# Patient Record
Sex: Female | Born: 1945 | Race: Black or African American | Hispanic: No | Marital: Married | Smoking: Never smoker
Health system: Southern US, Community
[De-identification: ages and names within clinical notes are randomized; demographics above are authoritative.]

## PROBLEM LIST (undated history)

## (undated) DIAGNOSIS — N390 Urinary tract infection, site not specified: Secondary | ICD-10-CM

## (undated) DIAGNOSIS — I700 Atherosclerosis of aorta: Secondary | ICD-10-CM

## (undated) DIAGNOSIS — J189 Pneumonia, unspecified organism: Secondary | ICD-10-CM

## (undated) DIAGNOSIS — M1990 Unspecified osteoarthritis, unspecified site: Secondary | ICD-10-CM

## (undated) DIAGNOSIS — K5790 Diverticulosis of intestine, part unspecified, without perforation or abscess without bleeding: Secondary | ICD-10-CM

## (undated) HISTORY — DX: Pneumonia, unspecified organism: J18.9

## (undated) HISTORY — PX: BREAST EXCISIONAL BIOPSY: SUR124

## (undated) HISTORY — DX: Atherosclerosis of aorta: I70.0

## (undated) HISTORY — DX: Diverticulosis of intestine, part unspecified, without perforation or abscess without bleeding: K57.90

## (undated) HISTORY — DX: Urinary tract infection, site not specified: N39.0

## (undated) HISTORY — DX: Unspecified osteoarthritis, unspecified site: M19.90

---

## 1999-03-09 HISTORY — PX: LUNG BIOPSY: SHX232

## 2001-02-21 ENCOUNTER — Other Ambulatory Visit: Admission: RE | Admit: 2001-02-21 | Discharge: 2001-02-21 | Payer: Self-pay | Admitting: Internal Medicine

## 2003-02-07 ENCOUNTER — Ambulatory Visit (HOSPITAL_COMMUNITY): Admission: RE | Admit: 2003-02-07 | Discharge: 2003-02-07 | Payer: Self-pay | Admitting: Gastroenterology

## 2004-02-10 ENCOUNTER — Encounter: Admission: RE | Admit: 2004-02-10 | Discharge: 2004-02-10 | Payer: Self-pay | Admitting: Rheumatology

## 2004-02-25 ENCOUNTER — Encounter: Admission: RE | Admit: 2004-02-25 | Discharge: 2004-02-25 | Payer: Self-pay | Admitting: Neurosurgery

## 2004-07-15 ENCOUNTER — Encounter: Admission: RE | Admit: 2004-07-15 | Discharge: 2004-07-15 | Payer: Self-pay | Admitting: Rheumatology

## 2004-07-16 ENCOUNTER — Encounter: Admission: RE | Admit: 2004-07-16 | Discharge: 2004-07-16 | Payer: Self-pay | Admitting: Rheumatology

## 2004-07-22 ENCOUNTER — Ambulatory Visit (HOSPITAL_COMMUNITY): Admission: RE | Admit: 2004-07-22 | Discharge: 2004-07-22 | Payer: Self-pay | Admitting: Internal Medicine

## 2004-07-23 ENCOUNTER — Encounter: Admission: RE | Admit: 2004-07-23 | Discharge: 2004-07-23 | Payer: Self-pay | Admitting: Rheumatology

## 2005-09-22 ENCOUNTER — Encounter: Admission: RE | Admit: 2005-09-22 | Discharge: 2005-09-22 | Payer: Self-pay | Admitting: Rheumatology

## 2005-11-10 ENCOUNTER — Ambulatory Visit (HOSPITAL_COMMUNITY): Admission: RE | Admit: 2005-11-10 | Discharge: 2005-11-10 | Payer: Self-pay | Admitting: Internal Medicine

## 2007-02-23 ENCOUNTER — Ambulatory Visit (HOSPITAL_COMMUNITY): Admission: RE | Admit: 2007-02-23 | Discharge: 2007-02-23 | Payer: Self-pay | Admitting: Internal Medicine

## 2008-02-26 ENCOUNTER — Ambulatory Visit (HOSPITAL_COMMUNITY): Admission: RE | Admit: 2008-02-26 | Discharge: 2008-02-26 | Payer: Self-pay | Admitting: Internal Medicine

## 2009-03-25 ENCOUNTER — Ambulatory Visit (HOSPITAL_COMMUNITY): Admission: RE | Admit: 2009-03-25 | Discharge: 2009-03-25 | Payer: Self-pay | Admitting: Internal Medicine

## 2010-04-06 ENCOUNTER — Other Ambulatory Visit (HOSPITAL_COMMUNITY): Payer: Self-pay | Admitting: Internal Medicine

## 2010-04-06 DIAGNOSIS — Z139 Encounter for screening, unspecified: Secondary | ICD-10-CM

## 2010-04-14 ENCOUNTER — Ambulatory Visit (HOSPITAL_COMMUNITY)
Admission: RE | Admit: 2010-04-14 | Discharge: 2010-04-14 | Disposition: A | Discharge status: Home / Self Care | Payer: BC Managed Care – PPO | Source: Ambulatory Visit | Attending: Internal Medicine | Admitting: Internal Medicine

## 2010-04-14 DIAGNOSIS — Z139 Encounter for screening, unspecified: Secondary | ICD-10-CM

## 2010-04-14 DIAGNOSIS — Z1231 Encounter for screening mammogram for malignant neoplasm of breast: Secondary | ICD-10-CM | POA: Insufficient documentation

## 2010-07-24 NOTE — Op Note (Signed)
Leah Bowen, Leah Bowen                      ACCOUNT NO.:  [id number]   MEDICAL RECORD NO.:  [id number]                   PATIENT TYPE:  AMB   LOCATION:  ENDO                                 FACILITY:   PHYSICIAN:  Anselmo Rod, M.D.               DATE OF BIRTH:  [birth year]   DATE OF PROCEDURE:  02/07/2003  DATE OF DISCHARGE:                                 OPERATIVE REPORT   PROCEDURE PERFORMED:  Colonoscopy.   ENDOSCOPIST:  Anselmo Rod, M.D.   INSTRUMENT USED:  Olympus video colonoscope.   INDICATIONS FOR PROCEDURE:  A 65 year old African-American female with a  history of change in bowel habits undergoing a screening colonoscopy to rule  out colonic polyps, masses, etc.   PREPROCEDURE PREPARATION:  Informed consent was procured from the patient.  The patient was fasted for eight hours prior to procedure and prepped with a  bottle of magnesium citrate and gallon of GoLYTELY the night prior to  procedure.   PREPROCEDURE PHYSICAL:  VITAL SIGNS:  The patient has stable vital signs.  NECK:  Supple.  CHEST:  Clear to auscultation.  CARDIAC:  S1, S2 regular.  ABDOMEN:  Soft with normal bowel sounds.   DESCRIPTION OF PROCEDURE:  The patient was placed in the left lateral  decubitus position and sedated with 50 mg of Demerol and 5 mg of Versed  intravenously. Once the patient was adequately sedated and maintained on low-  flow oxygen and continuous cardiac monitoring, the Olympus video colonoscope  was advanced from the rectum to the cecum and terminal ileum.  The  appendicular orifice and ileocecal valve were clearly visualized and  photographed.  No masses, polyps, erosions, ulcerations or diverticula seen.  The terminal ileum appearing healthy and without lesions.  Retroflexion in  the rectum revealed no abnormalities.   IMPRESSION:  1. Normal colonoscopy of the terminal ileum.  2. No masses, polyps or diverticulosis noted.   RECOMMENDATIONS:  1. High fiber diet  with liberal fluid intake has been advocated.  2. Repeat CRC screening is recommended in the next 10 years unless the     patient develops any abnormal symptoms in the interim.  3. Outpatient followup in the next four weeks or earlier if need be.                                               Anselmo Rod, M.D.   JNM/MEDQ  D:  02/07/2003  T:  02/08/2003  Job:  [id number]

## 2011-05-25 ENCOUNTER — Other Ambulatory Visit (HOSPITAL_COMMUNITY): Payer: Self-pay | Admitting: Internal Medicine

## 2011-05-25 DIAGNOSIS — Z1231 Encounter for screening mammogram for malignant neoplasm of breast: Secondary | ICD-10-CM

## 2011-06-17 ENCOUNTER — Ambulatory Visit (HOSPITAL_COMMUNITY): Payer: Self-pay

## 2011-06-18 ENCOUNTER — Ambulatory Visit (HOSPITAL_COMMUNITY)
Admission: RE | Admit: 2011-06-18 | Discharge: 2011-06-18 | Disposition: A | Discharge status: Home / Self Care | Payer: Medicare Other | Source: Ambulatory Visit | Attending: Internal Medicine | Admitting: Internal Medicine

## 2011-06-18 DIAGNOSIS — Z1231 Encounter for screening mammogram for malignant neoplasm of breast: Secondary | ICD-10-CM

## 2012-05-18 ENCOUNTER — Encounter: Payer: Self-pay | Admitting: Internal Medicine

## 2012-06-01 ENCOUNTER — Encounter: Payer: Self-pay | Admitting: Internal Medicine

## 2012-06-05 ENCOUNTER — Ambulatory Visit (INDEPENDENT_AMBULATORY_CARE_PROVIDER_SITE_OTHER): Payer: Medicare Other | Admitting: Internal Medicine

## 2012-06-05 ENCOUNTER — Encounter: Payer: Self-pay | Admitting: Internal Medicine

## 2012-06-05 ENCOUNTER — Other Ambulatory Visit (INDEPENDENT_AMBULATORY_CARE_PROVIDER_SITE_OTHER): Payer: Medicare Other

## 2012-06-05 VITALS — BP 118/60 | HR 72 | Ht 67.5 in | Wt 141.0 lb

## 2012-06-05 DIAGNOSIS — Z1211 Encounter for screening for malignant neoplasm of colon: Secondary | ICD-10-CM

## 2012-06-05 DIAGNOSIS — M1990 Unspecified osteoarthritis, unspecified site: Secondary | ICD-10-CM | POA: Insufficient documentation

## 2012-06-05 DIAGNOSIS — K5900 Constipation, unspecified: Secondary | ICD-10-CM

## 2012-06-05 DIAGNOSIS — R109 Unspecified abdominal pain: Secondary | ICD-10-CM

## 2012-06-05 LAB — TSH: TSH: 1.41 u[IU]/mL (ref 0.35–5.50)

## 2012-06-05 MED ORDER — POLYETHYLENE GLYCOL 3350 17 GM/SCOOP PO POWD: 17.0000 g | Freq: Every day | ORAL | Status: AC

## 2012-06-05 MED ORDER — PEG-KCL-NACL-NASULF-NA ASC-C 100 G PO SOLR: 1.0000 | Freq: Once | ORAL | Status: DC

## 2012-06-05 NOTE — Progress Notes (Signed)
Patient ID: Leah Bowen, female   DOB: 08-Apr-1945, 67 y.o.   MRN: [id number] HPI: Mrs. Coste is a 67 year old female with a past medical history of arthritis who seen in consultation at the request of Dr. Renae Gloss for evaluation of constipation change in bowel habits and left-sided abdominal pain. The patient reports her last 4-6 weeks of change in her bowel habits in that she's become more constipated. She also was seeing more narrow caliber stools and the feeling of incomplete evacuation. She reports abdominal fullness as well as griping and cramping worse on the left side. Several mites in the past month she has been awakened from sleep with a left-sided cramping abdominal pain. This also seems to be worse when her bladder is full. She modified her diet, specifically by decreasing intake to try to help with the pain. She also decreased her fiber and added olive oil. On one occasion she used an herbal laxative, 2 tablets which was Senokot and this significantly helped. She reports now her bowel movements have returned more towards normal occurring 2 times daily. They are formed a more normal shape. She denies rectal bleeding or melena. She also tried 2 weeks of a probiotic, Align, without much benefit. She still is having some intermittent discomfort in the left mid abdomen. No nausea or vomiting. No fevers or chills. Good appetite. Weight has been stable for her. She has screening colonoscopy performed by Dr. Loreta Ave 10 years ago, but she recalls is normal.  Past Medical History  Diagnosis Date  . Arthritis   . Pneumonia   . UTI (lower urinary tract infection)     Past Surgical History  Procedure Laterality Date  . Lung biopsy  2001    Current Outpatient Prescriptions  Medication Sig Dispense Refill  . peg 3350 powder (MOVIPREP) 100 G SOLR Take 1 kit (100 g total) by mouth once.  1 kit  0  . polyethylene glycol powder (GLYCOLAX/MIRALAX) powder Take 17 g by mouth daily.  255 g  3   No  current facility-administered medications for this visit.    Allergies  Allergen Reactions  . Lactose Intolerance (Gi)     Family History  Problem Relation Age of Onset  . Breast cancer Mother   . Uterine cancer Sister   . Diabetes Mother   . Diabetes Father   . Diabetes Sister   . Heart disease Father   . Kidney disease Father     History   Social History  . Marital Status: Married    Spouse Name: N/A    Number of Children: 1  . Years of Education: N/A   Social History Main Topics  . Smoking status: Never Smoker   . Smokeless tobacco: Never Used  . Alcohol Use: No  . Drug Use: No  . Sexually Active: None   Other Topics Concern  . None   Social History Narrative  . None    ROS: As per history of present illness, otherwise negative  BP 118/60  Pulse 72  Ht 5' 7.5" (1.715 m)  Wt 141 lb (63.957 kg)  BMI 21.75 kg/m2 Constitutional: Well-developed and well-nourished. No distress. HEENT: Normocephalic and atraumatic. Oropharynx is clear and moist. No oropharyngeal exudate. Conjunctivae are normal.  No scleral icterus. Neck: Neck supple. Trachea midline. Cardiovascular: Normal rate, regular rhythm and intact distal pulses. No M/R/G Pulmonary/chest: Effort normal and breath sounds normal. No wheezing, rales or rhonchi. Abdominal: Soft, nontender, nondistended. Bowel sounds active throughout. There are no masses palpable.  No hepatosplenomegaly. Extremities: no clubbing, cyanosis, or edema Lymphadenopathy: No cervical adenopathy noted. Neurological: Alert and oriented to person place and time. Skin: Skin is warm and dry. No rashes noted. Psychiatric: Normal mood and affect. Behavior is normal.  Colonoscopy performed 02/07/2003 -- exam to the cecum and terminal ileum. Normal colonoscopy of the terminal ileum. No masses, polyps, or diverticula noted. (Dr. Loreta Ave)  ASSESSMENT/PLAN:  67 year old female with a past medical history of arthritis who seen in consultation  at the request of Dr. Renae Gloss for evaluation of constipation change in bowel habits and left-sided abdominal pain.  1.  Change in bowel habits, mild constipation with left-sided abdominal discomfort -- the patient is due for repeat colonoscopy from a screening standpoint. Her change in bowel habits seem to be correcting without much intervention and she responded to over-the-counter laxative. I recommended a laxative on a scheduled basis to see if this helps fully relieve her left-sided abdominal discomfort. I recommended MiraLAX 17 g daily. If her stools are too loose she can take either a half dose daily or take 17 g every other day. I will also check a TSH today. Discussed colonoscopy including the risks and benefits and she is agreeable to proceed. Recommendations thereafter. If colonoscopy is unremarkable and her discomfort persists, my recommendation would be for CT scan with contrast. Her exam today is very benign and she has no abdominal tenderness making diverticulitis felt very unlikely

## 2012-06-05 NOTE — Patient Instructions (Addendum)
You have been scheduled for a colonoscopy with propofol. Please follow written instructions given to you at your visit today.  Please pick up your prep kit at the pharmacy within the next 1-3 days. If you use inhalers (even only as needed), please bring them with you on the day of your procedure.  We have sent the following medications to your pharmacy for you to pick up at your convenience: Miralax, take 17g daily. Moviprep; you were given instructions today at your office visit  Your physician has requested that you go to the basement for the following lab work before leaving today: TSH                                               We are excited to introduce MyChart, a new best-in-class service that provides you online access to important information in your electronic medical record. We want to make it easier for you to view your health information - all in one secure location - when and where you need it. We expect MyChart will enhance the quality of care and service we provide.  When you register for MyChart, you can:    View your test results.    Request appointments and receive appointment reminders via email.    Request medication renewals.    View your medical history, allergies, medications and immunizations.    Communicate with your physician's office through a password-protected site.    Conveniently print information such as your medication lists.  To find out if MyChart is right for you, please talk to a member of our clinical staff today. We will gladly answer your questions about this free health and wellness tool.  If you are age 41 or older and want a member of your family to have access to your record, you must provide written consent by completing a proxy form available at our office. Please speak to our clinical staff about guidelines regarding accounts for patients younger than age 36.  As you activate your MyChart account and need any technical assistance, please  call the MyChart technical support line at [phone]) 83-CHART 9083306186) or email your question to [email or web address]. If you email your question(s), please include your name, a return phone number and the best time to reach you.  If you have non-urgent health-related questions, you can send a message to our office through MyChart at [email or web address]. If you have a medical emergency, call 911.  Thank you for using MyChart as your new health and wellness resource!   MyChart licensed from [employer],  [id number]. Patents Pending.

## 2012-06-14 ENCOUNTER — Encounter: Payer: Self-pay | Admitting: Internal Medicine

## 2012-06-14 ENCOUNTER — Ambulatory Visit (AMBULATORY_SURGERY_CENTER): Payer: Medicare Other | Admitting: Internal Medicine

## 2012-06-14 VITALS — BP 110/64 | HR 63 | Temp 97.4°F | Resp 26 | Ht 67.5 in | Wt 141.0 lb

## 2012-06-14 DIAGNOSIS — K5900 Constipation, unspecified: Secondary | ICD-10-CM

## 2012-06-14 DIAGNOSIS — Z1211 Encounter for screening for malignant neoplasm of colon: Secondary | ICD-10-CM

## 2012-06-14 MED ORDER — SODIUM CHLORIDE 0.9 % IV SOLN: 500.0000 mL | INTRAVENOUS | Status: DC

## 2012-06-14 NOTE — Progress Notes (Signed)
A/ox3 pleased with MAC report to Jane RN 

## 2012-06-14 NOTE — Op Note (Signed)
[facility] [address] [place], [address]   COLONOSCOPY PROCEDURE REPORT  PATIENT: Leah Bowen, Leah Bowen  MR#: [id number] BIRTHDATE: 27-Nov-1945 , 66  yrs. old GENDER: Female ENDOSCOPIST: Beverley Fiedler, MD REFERRED XB:[id number], Kim PROCEDURE DATE:  06/14/2012 PROCEDURE:   Colonoscopy, screening ASA CLASS:   Class II INDICATIONS:average risk screening and Change in bowel habits. MEDICATIONS: MAC sedation, administered by CRNA and propofol (Diprivan) 200mg  IV  DESCRIPTION OF PROCEDURE:   After the risks benefits and alternatives of the procedure were thoroughly explained, informed consent was obtained.  A digital rectal exam revealed no rectal mass.   The LB [id number]-H180AL [id number]  endoscope was introduced through the anus and advanced to the cecum, which was identified by both the appendix and ileocecal valve. No adverse events experienced. The quality of the prep was good, using MoviPrep  The instrument was then slowly withdrawn as the colon was fully examined.    COLON FINDINGS: Mild diverticulosis was noted in the descending colon and sigmoid colon.   The colon was otherwise normal.  There was no inflammation, polyps or cancers seen unless previously stated.  Retroflexed views revealed small internal hemorrhoids. The time to cecum=8 minutes 28 seconds.  Withdrawal time=7 minutes 58 seconds.  The scope was withdrawn and the procedure completed. COMPLICATIONS: There were no complications.  ENDOSCOPIC IMPRESSION: 1.   Mild diverticulosis was noted in the descending colon and sigmoid colon 2.   The colon was otherwise normal  RECOMMENDATIONS: 1.  High fiber diet 2.  You should continue to follow colorectal cancer screening guidelines for "routine risk" patients with a repeat colonoscopy in 10 years.  There is no need for FOBT (stool) testing for at least 5 years. 3.   If left-sided abdominal pain persists or worsens, I recommend CT scan abd/pelvis for  further evaluation.   eSigned:  Beverley Fiedler, MD 06/14/2012 3:30 PM   cc: The Patient

## 2012-06-14 NOTE — Progress Notes (Signed)
Patient did not experience any of the following events: a burn prior to discharge; a fall within the facility; wrong site/side/patient/procedure/implant event; or a hospital transfer or hospital admission upon discharge from the facility. (G8907) Patient did not have preoperative order for IV antibiotic SSI prophylaxis. (G8918)  

## 2012-06-14 NOTE — Patient Instructions (Addendum)

## 2012-06-15 ENCOUNTER — Telehealth: Payer: Self-pay | Admitting: *Deleted

## 2012-06-15 NOTE — Telephone Encounter (Signed)
  Follow up Call-  Call back number 06/14/2012  Post procedure Call Back phone  # [phone]  Permission to leave phone message Yes     Patient questions:  Do you have a fever, pain , or abdominal swelling? no Pain Score  0 *  Have you tolerated food without any problems? yes  Have you been able to return to your normal activities? yes  Do you have any questions about your discharge instructions: Diet   no Medications  no Follow up visit  no  Do you have questions or concerns about your Care? no  Actions: * If pain score is 4 or above: No action needed, pain <4.

## 2012-06-29 ENCOUNTER — Telehealth: Payer: Self-pay | Admitting: Internal Medicine

## 2012-06-29 DIAGNOSIS — R1032 Left lower quadrant pain: Secondary | ICD-10-CM

## 2012-06-29 DIAGNOSIS — R109 Unspecified abdominal pain: Secondary | ICD-10-CM

## 2012-06-30 ENCOUNTER — Inpatient Hospital Stay: Admission: RE | Admit: 2012-06-30 | Payer: Medicare Other | Source: Ambulatory Visit

## 2012-06-30 ENCOUNTER — Other Ambulatory Visit (INDEPENDENT_AMBULATORY_CARE_PROVIDER_SITE_OTHER): Payer: Medicare Other

## 2012-06-30 DIAGNOSIS — R109 Unspecified abdominal pain: Secondary | ICD-10-CM

## 2012-06-30 LAB — BASIC METABOLIC PANEL
Chloride: 102 mEq/L (ref 96–112)
GFR: 89.59 mL/min (ref >60.00)
Glucose, Bld: 100 mg/dL — ABNORMAL HIGH (ref 70–99)
Potassium: 4.1 mEq/L (ref 3.5–5.1)
Sodium: 141 mEq/L (ref 135–145)

## 2012-06-30 NOTE — Telephone Encounter (Signed)
Per 06/14/12 COLON, Dr Rhea Belton  Wrote on recommendations that if pt's L side pain persists, we can order a CT.  Dr Rhea Belton was out yesterday, but OK to order per Willette Cluster, NP. Need to r/s for Monday for pt; r/s for 07/03/12 at 0830am. lmom for pt to call back.

## 2012-06-30 NOTE — Telephone Encounter (Signed)
Informed pt of CT appt on 07/03/12, be there at 0815an, nothing to eat or drink after 4:30am, drink contrast at 06:30 and 07:30am. Also informed her she need a lab prior to her CT; pt stated understanding.

## 2012-07-03 ENCOUNTER — Ambulatory Visit (INDEPENDENT_AMBULATORY_CARE_PROVIDER_SITE_OTHER)
Admission: RE | Admit: 2012-07-03 | Discharge: 2012-07-03 | Disposition: A | Discharge status: Home / Self Care | Payer: Medicare Other | Source: Ambulatory Visit | Attending: Internal Medicine | Admitting: Internal Medicine

## 2012-07-03 DIAGNOSIS — R1032 Left lower quadrant pain: Secondary | ICD-10-CM

## 2012-07-03 MED ORDER — IOHEXOL 300 MG/ML  SOLN
100.0000 mL | Freq: Once | INTRAMUSCULAR | Status: AC | PRN
  Administered 2012-07-03: 100 mL via INTRAVENOUS

## 2012-07-05 ENCOUNTER — Telehealth: Payer: Self-pay | Admitting: Internal Medicine

## 2012-07-05 NOTE — Telephone Encounter (Signed)
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 Occasionally lower back pain can lead to anterior abdominal pain - she can discuss this further with her primary care doctor   We also want to assure she is not constipated. I had previously recommended MiraLax which she can continue to use to help keep her bowel habits regular.   Office followup can be offered to assure improvement     Informed pt of results per dr Rhea Belton. She stated understanding and asked that I mail her a copy of the scan; done.

## 2014-05-01 ENCOUNTER — Other Ambulatory Visit: Payer: Self-pay | Admitting: Internal Medicine

## 2014-05-01 DIAGNOSIS — R040 Epistaxis: Secondary | ICD-10-CM

## 2014-05-07 ENCOUNTER — Ambulatory Visit
Admission: RE | Admit: 2014-05-07 | Discharge: 2014-05-07 | Disposition: A | Discharge status: Home / Self Care | Payer: Medicare Other | Source: Ambulatory Visit | Attending: Internal Medicine | Admitting: Internal Medicine

## 2014-05-07 DIAGNOSIS — R040 Epistaxis: Secondary | ICD-10-CM

## 2014-05-24 ENCOUNTER — Other Ambulatory Visit: Payer: Self-pay

## 2014-05-24 DIAGNOSIS — Z1231 Encounter for screening mammogram for malignant neoplasm of breast: Secondary | ICD-10-CM

## 2014-05-24 DIAGNOSIS — R5381 Other malaise: Secondary | ICD-10-CM

## 2014-05-29 ENCOUNTER — Other Ambulatory Visit: Payer: Self-pay | Admitting: Internal Medicine

## 2014-05-29 ENCOUNTER — Other Ambulatory Visit: Payer: Self-pay

## 2014-06-05 ENCOUNTER — Other Ambulatory Visit: Payer: Self-pay | Admitting: Internal Medicine

## 2014-06-05 ENCOUNTER — Other Ambulatory Visit: Payer: Self-pay

## 2014-06-05 DIAGNOSIS — R5381 Other malaise: Secondary | ICD-10-CM

## 2014-06-05 DIAGNOSIS — E2839 Other primary ovarian failure: Secondary | ICD-10-CM

## 2014-06-06 ENCOUNTER — Ambulatory Visit
Admission: RE | Admit: 2014-06-06 | Discharge: 2014-06-06 | Disposition: A | Discharge status: Home / Self Care | Payer: Medicare Other | Source: Ambulatory Visit | Attending: Internal Medicine | Admitting: Internal Medicine

## 2014-06-06 ENCOUNTER — Ambulatory Visit
Admission: RE | Admit: 2014-06-06 | Discharge: 2014-06-06 | Disposition: A | Discharge status: Home / Self Care | Payer: Medicare Other | Source: Ambulatory Visit

## 2014-06-06 DIAGNOSIS — Z1231 Encounter for screening mammogram for malignant neoplasm of breast: Secondary | ICD-10-CM

## 2014-06-06 DIAGNOSIS — E2839 Other primary ovarian failure: Secondary | ICD-10-CM

## 2015-06-04 ENCOUNTER — Other Ambulatory Visit: Payer: Self-pay

## 2015-06-04 DIAGNOSIS — Z1231 Encounter for screening mammogram for malignant neoplasm of breast: Secondary | ICD-10-CM

## 2015-06-23 ENCOUNTER — Ambulatory Visit
Admission: RE | Admit: 2015-06-23 | Discharge: 2015-06-23 | Disposition: A | Discharge status: Home / Self Care | Payer: Medicare Other | Source: Ambulatory Visit

## 2015-06-23 DIAGNOSIS — Z1231 Encounter for screening mammogram for malignant neoplasm of breast: Secondary | ICD-10-CM

## 2016-06-09 ENCOUNTER — Other Ambulatory Visit: Payer: Self-pay | Admitting: Internal Medicine

## 2016-06-09 DIAGNOSIS — E2839 Other primary ovarian failure: Secondary | ICD-10-CM

## 2016-06-09 DIAGNOSIS — Z1231 Encounter for screening mammogram for malignant neoplasm of breast: Secondary | ICD-10-CM

## 2016-07-13 ENCOUNTER — Ambulatory Visit
Admission: RE | Admit: 2016-07-13 | Discharge: 2016-07-13 | Disposition: A | Discharge status: Home / Self Care | Payer: Medicare Other | Source: Ambulatory Visit | Attending: Internal Medicine | Admitting: Internal Medicine

## 2016-07-13 DIAGNOSIS — E2839 Other primary ovarian failure: Secondary | ICD-10-CM

## 2016-07-13 DIAGNOSIS — Z1231 Encounter for screening mammogram for malignant neoplasm of breast: Secondary | ICD-10-CM

## 2018-07-03 ENCOUNTER — Other Ambulatory Visit: Payer: Self-pay | Admitting: Internal Medicine

## 2018-07-03 DIAGNOSIS — Z1231 Encounter for screening mammogram for malignant neoplasm of breast: Secondary | ICD-10-CM

## 2018-08-31 ENCOUNTER — Ambulatory Visit
Admission: RE | Admit: 2018-08-31 | Discharge: 2018-08-31 | Disposition: A | Discharge status: Home / Self Care | Payer: Medicare Other | Source: Ambulatory Visit | Attending: Internal Medicine | Admitting: Internal Medicine

## 2018-08-31 DIAGNOSIS — Z1231 Encounter for screening mammogram for malignant neoplasm of breast: Secondary | ICD-10-CM

## 2019-03-26 ENCOUNTER — Ambulatory Visit: Payer: Medicare Other | Attending: Internal Medicine

## 2019-03-26 DIAGNOSIS — Z20822 Contact with and (suspected) exposure to covid-19: Secondary | ICD-10-CM

## 2019-03-27 LAB — NOVEL CORONAVIRUS, NAA: SARS-CoV-2, NAA: NOT DETECTED

## 2019-05-14 DIAGNOSIS — S161XXA Strain of muscle, fascia and tendon at neck level, initial encounter: Secondary | ICD-10-CM | POA: Diagnosis not present

## 2019-05-14 DIAGNOSIS — M069 Rheumatoid arthritis, unspecified: Secondary | ICD-10-CM | POA: Diagnosis not present

## 2019-05-23 DIAGNOSIS — M2560 Stiffness of unspecified joint, not elsewhere classified: Secondary | ICD-10-CM | POA: Diagnosis not present

## 2019-05-25 DIAGNOSIS — M2560 Stiffness of unspecified joint, not elsewhere classified: Secondary | ICD-10-CM | POA: Diagnosis not present

## 2019-05-29 DIAGNOSIS — M2560 Stiffness of unspecified joint, not elsewhere classified: Secondary | ICD-10-CM | POA: Diagnosis not present

## 2019-06-01 DIAGNOSIS — M2560 Stiffness of unspecified joint, not elsewhere classified: Secondary | ICD-10-CM | POA: Diagnosis not present

## 2019-06-05 DIAGNOSIS — M2560 Stiffness of unspecified joint, not elsewhere classified: Secondary | ICD-10-CM | POA: Diagnosis not present

## 2019-06-08 DIAGNOSIS — M2560 Stiffness of unspecified joint, not elsewhere classified: Secondary | ICD-10-CM | POA: Diagnosis not present

## 2019-06-12 DIAGNOSIS — M2560 Stiffness of unspecified joint, not elsewhere classified: Secondary | ICD-10-CM | POA: Diagnosis not present

## 2019-06-15 DIAGNOSIS — M2560 Stiffness of unspecified joint, not elsewhere classified: Secondary | ICD-10-CM | POA: Diagnosis not present

## 2019-07-10 DIAGNOSIS — B029 Zoster without complications: Secondary | ICD-10-CM | POA: Diagnosis not present

## 2019-08-23 DIAGNOSIS — H43811 Vitreous degeneration, right eye: Secondary | ICD-10-CM | POA: Diagnosis not present

## 2019-08-23 DIAGNOSIS — H04123 Dry eye syndrome of bilateral lacrimal glands: Secondary | ICD-10-CM | POA: Diagnosis not present

## 2019-08-23 DIAGNOSIS — H17822 Peripheral opacity of cornea, left eye: Secondary | ICD-10-CM | POA: Diagnosis not present

## 2019-08-23 DIAGNOSIS — H25813 Combined forms of age-related cataract, bilateral: Secondary | ICD-10-CM | POA: Diagnosis not present

## 2019-08-23 DIAGNOSIS — H18423 Band keratopathy, bilateral: Secondary | ICD-10-CM | POA: Diagnosis not present

## 2019-09-05 DIAGNOSIS — H18422 Band keratopathy, left eye: Secondary | ICD-10-CM | POA: Diagnosis not present

## 2019-09-05 DIAGNOSIS — Z01419 Encounter for gynecological examination (general) (routine) without abnormal findings: Secondary | ICD-10-CM | POA: Diagnosis not present

## 2019-09-05 DIAGNOSIS — E782 Mixed hyperlipidemia: Secondary | ICD-10-CM | POA: Diagnosis not present

## 2019-09-05 DIAGNOSIS — Z01411 Encounter for gynecological examination (general) (routine) with abnormal findings: Secondary | ICD-10-CM | POA: Diagnosis not present

## 2019-09-05 DIAGNOSIS — R7309 Other abnormal glucose: Secondary | ICD-10-CM | POA: Diagnosis not present

## 2019-09-05 DIAGNOSIS — E559 Vitamin D deficiency, unspecified: Secondary | ICD-10-CM | POA: Diagnosis not present

## 2019-09-27 ENCOUNTER — Other Ambulatory Visit: Payer: Self-pay | Admitting: Internal Medicine

## 2019-09-27 DIAGNOSIS — Z1231 Encounter for screening mammogram for malignant neoplasm of breast: Secondary | ICD-10-CM

## 2019-10-17 ENCOUNTER — Other Ambulatory Visit: Payer: Self-pay

## 2019-10-17 ENCOUNTER — Ambulatory Visit
Admission: RE | Admit: 2019-10-17 | Discharge: 2019-10-17 | Disposition: A | Discharge status: Home / Self Care | Payer: Medicare Other | Source: Ambulatory Visit | Attending: Internal Medicine | Admitting: Internal Medicine

## 2019-10-17 DIAGNOSIS — Z1231 Encounter for screening mammogram for malignant neoplasm of breast: Secondary | ICD-10-CM

## 2019-12-10 DIAGNOSIS — I8393 Asymptomatic varicose veins of bilateral lower extremities: Secondary | ICD-10-CM | POA: Diagnosis not present

## 2019-12-31 ENCOUNTER — Ambulatory Visit: Payer: Medicare Other | Attending: Internal Medicine

## 2019-12-31 DIAGNOSIS — Z23 Encounter for immunization: Secondary | ICD-10-CM

## 2019-12-31 NOTE — Progress Notes (Signed)
   Covid-19 Vaccination Clinic  Name:  TANDA MORRISSEY    MRN: [id number] DOB: Jul 23, 1945  12/31/2019  Ms. Haviland was observed post Covid-19 immunization for 15 minutes without incident. She was provided with Vaccine Information Sheet and instruction to access the V-Safe system.   Ms. Kerce was instructed to call 911 with any severe reactions post vaccine: Marland Kitchen Difficulty breathing  . Swelling of face and throat  . A fast heartbeat  . A bad rash all over body  . Dizziness and weakness

## 2020-03-25 ENCOUNTER — Other Ambulatory Visit: Payer: Medicare Other

## 2020-03-27 ENCOUNTER — Other Ambulatory Visit: Payer: Medicare Other

## 2020-03-27 DIAGNOSIS — Z20822 Contact with and (suspected) exposure to covid-19: Secondary | ICD-10-CM

## 2020-03-29 LAB — SARS-COV-2, NAA 2 DAY TAT

## 2020-03-29 LAB — NOVEL CORONAVIRUS, NAA: SARS-CoV-2, NAA: NOT DETECTED

## 2020-08-26 DIAGNOSIS — H43811 Vitreous degeneration, right eye: Secondary | ICD-10-CM | POA: Diagnosis not present

## 2020-08-26 DIAGNOSIS — H04123 Dry eye syndrome of bilateral lacrimal glands: Secondary | ICD-10-CM | POA: Diagnosis not present

## 2020-08-26 DIAGNOSIS — H17822 Peripheral opacity of cornea, left eye: Secondary | ICD-10-CM | POA: Diagnosis not present

## 2020-08-26 DIAGNOSIS — H18423 Band keratopathy, bilateral: Secondary | ICD-10-CM | POA: Diagnosis not present

## 2020-08-26 DIAGNOSIS — H25813 Combined forms of age-related cataract, bilateral: Secondary | ICD-10-CM | POA: Diagnosis not present

## 2020-09-25 DIAGNOSIS — R7303 Prediabetes: Secondary | ICD-10-CM | POA: Diagnosis not present

## 2020-09-25 DIAGNOSIS — H189 Unspecified disorder of cornea: Secondary | ICD-10-CM | POA: Diagnosis not present

## 2020-09-25 DIAGNOSIS — Z13220 Encounter for screening for lipoid disorders: Secondary | ICD-10-CM | POA: Diagnosis not present

## 2020-09-25 DIAGNOSIS — Z0000 Encounter for general adult medical examination without abnormal findings: Secondary | ICD-10-CM | POA: Diagnosis not present

## 2020-09-25 DIAGNOSIS — Z136 Encounter for screening for cardiovascular disorders: Secondary | ICD-10-CM | POA: Diagnosis not present

## 2020-09-25 DIAGNOSIS — E559 Vitamin D deficiency, unspecified: Secondary | ICD-10-CM | POA: Diagnosis not present

## 2020-09-25 DIAGNOSIS — M810 Age-related osteoporosis without current pathological fracture: Secondary | ICD-10-CM | POA: Diagnosis not present

## 2020-09-25 DIAGNOSIS — Z1211 Encounter for screening for malignant neoplasm of colon: Secondary | ICD-10-CM | POA: Diagnosis not present

## 2020-10-01 DIAGNOSIS — Z1211 Encounter for screening for malignant neoplasm of colon: Secondary | ICD-10-CM | POA: Diagnosis not present

## 2020-11-05 ENCOUNTER — Other Ambulatory Visit: Payer: Self-pay | Admitting: Family Medicine

## 2020-11-05 DIAGNOSIS — Z1231 Encounter for screening mammogram for malignant neoplasm of breast: Secondary | ICD-10-CM

## 2020-12-11 ENCOUNTER — Ambulatory Visit
Admission: RE | Admit: 2020-12-11 | Discharge: 2020-12-11 | Disposition: A | Discharge status: Home / Self Care | Payer: Medicare Other | Source: Ambulatory Visit | Attending: Family Medicine | Admitting: Family Medicine

## 2020-12-11 ENCOUNTER — Other Ambulatory Visit: Payer: Self-pay

## 2020-12-11 DIAGNOSIS — Z1231 Encounter for screening mammogram for malignant neoplasm of breast: Secondary | ICD-10-CM | POA: Diagnosis not present

## 2021-01-05 DIAGNOSIS — H189 Unspecified disorder of cornea: Secondary | ICD-10-CM | POA: Diagnosis not present

## 2021-03-17 DIAGNOSIS — Z1152 Encounter for screening for COVID-19: Secondary | ICD-10-CM | POA: Diagnosis not present

## 2021-08-27 DIAGNOSIS — H18423 Band keratopathy, bilateral: Secondary | ICD-10-CM | POA: Diagnosis not present

## 2021-08-27 DIAGNOSIS — H25813 Combined forms of age-related cataract, bilateral: Secondary | ICD-10-CM | POA: Diagnosis not present

## 2021-08-27 DIAGNOSIS — H17822 Peripheral opacity of cornea, left eye: Secondary | ICD-10-CM | POA: Diagnosis not present

## 2021-08-27 DIAGNOSIS — H43811 Vitreous degeneration, right eye: Secondary | ICD-10-CM | POA: Diagnosis not present

## 2021-08-27 DIAGNOSIS — H04123 Dry eye syndrome of bilateral lacrimal glands: Secondary | ICD-10-CM | POA: Diagnosis not present

## 2021-09-29 DIAGNOSIS — R7303 Prediabetes: Secondary | ICD-10-CM | POA: Diagnosis not present

## 2021-09-29 DIAGNOSIS — Z13220 Encounter for screening for lipoid disorders: Secondary | ICD-10-CM | POA: Diagnosis not present

## 2021-09-29 DIAGNOSIS — Z23 Encounter for immunization: Secondary | ICD-10-CM | POA: Diagnosis not present

## 2021-09-29 DIAGNOSIS — Z0000 Encounter for general adult medical examination without abnormal findings: Secondary | ICD-10-CM | POA: Diagnosis not present

## 2021-09-29 DIAGNOSIS — R198 Other specified symptoms and signs involving the digestive system and abdomen: Secondary | ICD-10-CM | POA: Diagnosis not present

## 2021-09-29 DIAGNOSIS — Z136 Encounter for screening for cardiovascular disorders: Secondary | ICD-10-CM | POA: Diagnosis not present

## 2021-09-29 DIAGNOSIS — R1012 Left upper quadrant pain: Secondary | ICD-10-CM | POA: Diagnosis not present

## 2021-09-29 DIAGNOSIS — Z7185 Encounter for immunization safety counseling: Secondary | ICD-10-CM | POA: Diagnosis not present

## 2021-09-29 DIAGNOSIS — M810 Age-related osteoporosis without current pathological fracture: Secondary | ICD-10-CM | POA: Diagnosis not present

## 2021-09-30 ENCOUNTER — Ambulatory Visit
Admission: RE | Admit: 2021-09-30 | Discharge: 2021-09-30 | Disposition: A | Discharge status: Home / Self Care | Payer: Medicare Other | Source: Ambulatory Visit | Attending: Family Medicine | Admitting: Family Medicine

## 2021-09-30 ENCOUNTER — Other Ambulatory Visit: Payer: Self-pay | Admitting: Family Medicine

## 2021-09-30 DIAGNOSIS — R1012 Left upper quadrant pain: Secondary | ICD-10-CM | POA: Diagnosis not present

## 2021-09-30 DIAGNOSIS — R198 Other specified symptoms and signs involving the digestive system and abdomen: Secondary | ICD-10-CM

## 2021-09-30 DIAGNOSIS — I700 Atherosclerosis of aorta: Secondary | ICD-10-CM | POA: Diagnosis not present

## 2021-09-30 DIAGNOSIS — R140 Abdominal distension (gaseous): Secondary | ICD-10-CM | POA: Diagnosis not present

## 2021-09-30 MED ORDER — IOPAMIDOL (ISOVUE-300) INJECTION 61%
100.0000 mL | Freq: Once | INTRAVENOUS | Status: AC | PRN
  Administered 2021-09-30: 100 mL via INTRAVENOUS

## 2021-10-01 ENCOUNTER — Other Ambulatory Visit: Payer: Self-pay | Admitting: Family Medicine

## 2021-10-01 DIAGNOSIS — Z1231 Encounter for screening mammogram for malignant neoplasm of breast: Secondary | ICD-10-CM

## 2021-10-01 DIAGNOSIS — M810 Age-related osteoporosis without current pathological fracture: Secondary | ICD-10-CM

## 2021-12-14 ENCOUNTER — Ambulatory Visit
Admission: RE | Admit: 2021-12-14 | Discharge: 2021-12-14 | Disposition: A | Discharge status: Home / Self Care | Payer: Medicare Other | Source: Ambulatory Visit | Attending: Family Medicine | Admitting: Family Medicine

## 2021-12-14 ENCOUNTER — Telehealth: Payer: Self-pay | Admitting: Internal Medicine

## 2021-12-14 DIAGNOSIS — Z1231 Encounter for screening mammogram for malignant neoplasm of breast: Secondary | ICD-10-CM | POA: Diagnosis not present

## 2021-12-14 NOTE — Telephone Encounter (Signed)
Patient came into the office, has not been since since 2014 w/ Dr.Pyrtle,Technically would be a new patient says she is having LL abdominal pain when she moves. I let her know Dr.Pyrtle did not have any openings that she could get in with a PA but declined and said she rather see him. Told her a nurse would call her to further talk about her symptoms and schedule her if need be. She also asked about her colonoscopy and I advised patient that she wasn't due until next year, still said she wanted to speak to a nurse to see if she needed to be scheduled.

## 2021-12-15 NOTE — Telephone Encounter (Signed)
Patient is returning your call.  

## 2021-12-15 NOTE — Telephone Encounter (Signed)
Left message for pt to call back.  Pt states she has been having pain in her LUQ right under her ribcage. She was also bloated but this has gone away. She has been trying to eat right and no reports she is better with just some soreness under the left rib. Pt wants to know if she should have colon done earlier than April. Pt scheduled to see Dr. Hilarie Fredrickson 02/05/22 at 11am. Pt aware of appt.

## 2022-01-15 DIAGNOSIS — R7303 Prediabetes: Secondary | ICD-10-CM | POA: Diagnosis not present

## 2022-01-15 DIAGNOSIS — R1012 Left upper quadrant pain: Secondary | ICD-10-CM | POA: Diagnosis not present

## 2022-01-15 DIAGNOSIS — I700 Atherosclerosis of aorta: Secondary | ICD-10-CM | POA: Diagnosis not present

## 2022-01-15 DIAGNOSIS — Z7185 Encounter for immunization safety counseling: Secondary | ICD-10-CM | POA: Diagnosis not present

## 2022-01-15 DIAGNOSIS — Z23 Encounter for immunization: Secondary | ICD-10-CM | POA: Diagnosis not present

## 2022-02-01 ENCOUNTER — Encounter: Payer: Self-pay | Admitting: *Deleted

## 2022-02-05 ENCOUNTER — Ambulatory Visit (INDEPENDENT_AMBULATORY_CARE_PROVIDER_SITE_OTHER): Payer: Medicare Other | Admitting: Internal Medicine

## 2022-02-05 ENCOUNTER — Encounter: Payer: Self-pay | Admitting: Internal Medicine

## 2022-02-05 VITALS — BP 132/68 | HR 80 | Ht 67.0 in | Wt 140.5 lb

## 2022-02-05 DIAGNOSIS — R1012 Left upper quadrant pain: Secondary | ICD-10-CM

## 2022-02-05 DIAGNOSIS — K5900 Constipation, unspecified: Secondary | ICD-10-CM | POA: Diagnosis not present

## 2022-02-05 NOTE — Patient Instructions (Addendum)
  If you are age 76 or older, your body mass index should be between 23-30. Your Body mass index is 22.01 kg/m. If this is out of the aforementioned range listed, please consider follow up with your Primary Care Provider.  If you are age 38 or younger, your body mass index should be between 19-25. Your Body mass index is 22.01 kg/m. If this is out of the aformentioned range listed, please consider follow up with your Primary Care Provider.   ________________________________________________________  The [place] [facility] providers would like to encourage you to use [facility] to communicate with providers for non-urgent requests or questions.  Due to long hold times on the telephone, sending your provider a message by [facility] may be a faster and more efficient way to get a response.  Please allow 48 business hours for a response.  Please remember that this is for non-urgent requests.   Your provider has ordered "Diatherix" stool testing for you. You have received a kit from our office today containing all necessary supplies to complete this test. Please carefully read the stool collection instructions provided in the kit before opening the accompanying materials. In addition, be sure to place the label from the top left corner of the laboratory request sheet onto the "puritan opti-swab" tube that is supplied in the kit. This label should include your full name and date of birth. After completing the test, you should secure the purtian tube into the specimen biohazard bag. The laboratory request information sheet (including date and time of specimen collection) should be placed into the outside pocket of the specimen biohazard bag and returned to the [facility] lab with 2 days of collection.    Due to recent changes in healthcare laws, you may see the results of your imaging and laboratory studies on MyChart before your provider has had a chance to review them.  We understand that in some cases there may be results  that are confusing or concerning to you. Not all laboratory results come back in the same time frame and the provider may be waiting for multiple results in order to interpret others.  Please give [place] 48 hours in order for your provider to thoroughly review all the results before contacting the office for clarification of your results.    Thank you for entrusting me with your care and choosing [facility].  Dr Hilarie Fredrickson

## 2022-02-05 NOTE — Progress Notes (Signed)
Patient ID: ALEICIA KENAGY, female   DOB: 01/04/1946, 76 y.o.   MRN: [id number] HPI: Amiracle Neises is a 76 year old female known to me from 2014 and reevaluated her for change in bowel habits with mild constipation and left abdominal discomfort seen to discuss left upper quadrant pain.  She has a history of diverticulosis, aortic atherosclerosis and arthritis.  She is here alone today.  Colonoscopy performed on 06/14/2012 showed mild diverticulosis in the descending and sigmoid colon and was otherwise normal.  She reports that over the last 4 to 6 months she has developed left upper quadrant soreness and discomfort.  This is 2 out of 10 in intensity and she is not taking anything specifically for this.  Seem to bother her more earlier this year, then got better for a few months but then has returned.  Seems to be worse with acidic foods and she is try to avoid foods like tomatoes.  Also eating smaller portions.  Lost about 5 pounds.  No nausea or vomiting.  No heartburn.  Pepto-Bismol seems to make the pain go away but then it comes back if she does not use it.  Bowel movements have been regular though not necessarily at the same time each day.  She prefers to have her bowel movements in the morning though had a large normal bowel movement yesterday evening.  She will occasionally get constipated and she treats this with olive oil.  She previously tried MiraLAX but stopped this when she did not think she needed it any longer.  No blood in stool or melena.  She has had shingles about 3 years ago but it involved her left upper neck region.  No report of shingles in the left upper quadrant.  She had a CT also this year performed for this pain.  Past Medical History:  Diagnosis Date   Aortic atherosclerosis (HCC)    Arthritis    Diverticulosis    Pneumonia    UTI (lower urinary tract infection)     Past Surgical History:  Procedure Laterality Date   BREAST EXCISIONAL BIOPSY Right    No  Scar seen    LUNG BIOPSY  2001    Outpatient Medications Prior to Visit  Medication Sig Dispense Refill   aspirin EC 81 MG tablet Take 81 mg by mouth daily. Swallow whole.     Cholecalciferol (VITAMIN D3) 1.25 MG (50000 UT) CAPS Take 1 capsule by mouth daily.     omega-3 acid ethyl esters (LOVAZA) 1 g capsule Take 1 g by mouth 3 (three) times a week.     polyethylene glycol powder (GLYCOLAX/MIRALAX) powder Take 17 g by mouth daily. (Patient not taking: Reported on 02/05/2022) 255 g 3   No facility-administered medications prior to visit.    Allergies  Allergen Reactions   Lactose Intolerance (Gi)    Latex Hives and Rash    Family History  Problem Relation Age of Onset   Breast cancer Mother    Diabetes Mother    Diabetes Father    Heart disease Father    Kidney disease Father    Ulcers Father    Uterine cancer Sister    Diabetes Sister     Social History   Tobacco Use   Smoking status: Never   Smokeless tobacco: Never  Substance Use Topics   Alcohol use: No   Drug use: No    ROS: As per history of present illness, otherwise negative  BP 132/68   Pulse 80  Ht 5\' 7"  (1.702 m)   Wt 140 lb 8 oz (63.7 kg)   BMI 22.01 kg/m  Gen: awake, alert, NAD HEENT: anicteric  CV: RRR, no mrg Pulm: CTA b/l Abd: soft, NT/ND, +BS throughout Ext: no c/c/e Neuro: nonfocal    CT ABDOMEN AND PELVIS WITH CONTRAST   TECHNIQUE: Multidetector CT imaging of the abdomen and pelvis was performed using the standard protocol following bolus administration of intravenous contrast.   RADIATION DOSE REDUCTION: This exam was performed according to the departmental dose-optimization program which includes automated exposure control, adjustment of the mA and/or kV according to patient size and/or use of iterative reconstruction technique.   CONTRAST:  139mL ISOVUE-300 IOPAMIDOL (ISOVUE-300) INJECTION 61%   COMPARISON:  07/03/2012   FINDINGS: Lower Chest: No acute findings.    Hepatobiliary: No hepatic masses identified. Gallbladder is unremarkable. No evidence of biliary ductal dilatation.   Pancreas:  No mass or inflammatory changes.   Spleen: Within normal limits in size and appearance.   Adrenals/Urinary Tract: No masses identified. Small benign right upper pole renal cyst again noted (no followup imaging recommended). No evidence of ureteral calculi or hydronephrosis.   Stomach/Bowel: No evidence of obstruction, inflammatory process or abnormal fluid collections. Normal appendix visualized.   Vascular/Lymphatic: No pathologically enlarged lymph nodes. No acute vascular findings. Aortic atherosclerotic calcification incidentally noted.   Reproductive:  No mass or other significant abnormality.   Other:  None.   Musculoskeletal:  No suspicious bone lesions identified.   IMPRESSION: No acute findings or other significant abnormality.   Aortic Atherosclerosis (ICD10-I70.0).     Electronically Signed   By: Marlaine Hind M.D.   On: 09/30/2021 11:25   ASSESSMENT/PLAN: 76 year old female known to me from 2014 and reevaluated her for change in bowel habits with mild constipation and left abdominal discomfort seen to discuss left upper quadrant pain.  Left upper quadrant pain --seems dyspeptic in nature given that acidic foods seem to make it worse and Pepto-Bismol seems to make it better.  Also in the differential would be constipation.  No evidence for concerning findings by cross-sectional imaging, CT scan with contrast in July.  No splenomegaly and spleen is not palpable.  No rash in this area.  This seems a bit more diffuse than pinpoint abdominal wall pain to me. -- [facility] H. pylori stool test -- If H. pylori negative I would recommend pantoprazole 40 mg daily x 30 days -- If no response to PPI then would consider upper endoscopy  2.  Mild constipation - -patient does not feel this is currently an issue which seems to be accurate as she has  having regular bowel movements.  Constipation not suggested by cross-sectional imaging.  Could use MiraLAX in the future if needed  3.  Colon cancer screening --consider screening colonoscopy around April 2024 which would be 10 years after her last normal exam.      [id number], Apolonio Schneiders, [place],  [place] [address]

## 2022-02-08 ENCOUNTER — Other Ambulatory Visit: Payer: Medicare Other

## 2022-02-12 ENCOUNTER — Telehealth: Payer: Self-pay | Admitting: *Deleted

## 2022-02-12 NOTE — Telephone Encounter (Signed)
Patient has been advised of Dr Lauro Franklin interpretation of results as well as his recommendations. Patient indicates that she had 3 vomiting episodes last night and felt that she needed to have a bowel movement at that time as well but she was unable to produce a movement. She asks if she can take a mild laxative for constipation. I have advised that Dr Lauro Franklin last office note indicates she may take Miralax when needed for constipation and that she can titrate this medication as needed. In addition, patient is not currently on PPI, so I have sent a prescription of pantoprazole 40 mg for her to take once daily x 30 days (as per Dr Lauro Franklin last office note). I have advised if she does not improve with these measures, she should contact us. She verbalizes understanding of this information.

## 2022-02-12 NOTE — Telephone Encounter (Signed)
Dr Rhea Belton has received lab results from [facility] which was collected 02/07/22. Per Dr Rhea Belton, "02/10/22 please inform patient H pylori negative. PPI trial and if persistent symptoms, EGD. -JMP"  I have left a message for patient to return my call.

## 2022-02-18 MED ORDER — PANTOPRAZOLE SODIUM 40 MG PO TBEC: 40.0000 mg | DELAYED_RELEASE_TABLET | Freq: Every day | ORAL | 0 refills | Status: AC

## 2022-02-18 NOTE — Telephone Encounter (Signed)
Per patients request pantoprazole sent to [facility] on [address]

## 2022-02-18 NOTE — Addendum Note (Signed)
Addended by: Jillene Bucks L on: 02/18/2022 02:56 PM   Modules accepted: Orders

## 2022-02-18 NOTE — Telephone Encounter (Signed)
Inbound call from patient to verify her pharmacy at [facility] on [address] for medication [place]? Please advise.

## 2022-03-30 ENCOUNTER — Ambulatory Visit
Admission: RE | Admit: 2022-03-30 | Discharge: 2022-03-30 | Disposition: A | Discharge status: Home / Self Care | Payer: Medicare Other | Source: Ambulatory Visit | Attending: Family Medicine | Admitting: Family Medicine

## 2022-03-30 DIAGNOSIS — Z780 Asymptomatic menopausal state: Secondary | ICD-10-CM | POA: Diagnosis not present

## 2022-03-30 DIAGNOSIS — M8588 Other specified disorders of bone density and structure, other site: Secondary | ICD-10-CM | POA: Diagnosis not present

## 2022-03-30 DIAGNOSIS — M810 Age-related osteoporosis without current pathological fracture: Secondary | ICD-10-CM

## 2022-04-09 LAB — GLUCOSE, POCT (MANUAL RESULT ENTRY): POC Glucose: 87 mg/dl (ref 70–99)

## 2022-04-20 ENCOUNTER — Encounter: Payer: Self-pay | Admitting: *Deleted

## 2022-04-20 NOTE — Progress Notes (Signed)
Pt  has established PCP whom she has seen throughout the year, as well as GI support, has been participating in annual mmg screening. Screening results wnl - no SDOH barriers flagged at 04/09/22 event- No additional health equity support indicated at this time.

## 2022-07-13 ENCOUNTER — Encounter: Payer: Self-pay | Admitting: Internal Medicine

## 2022-08-31 DIAGNOSIS — H18423 Band keratopathy, bilateral: Secondary | ICD-10-CM | POA: Diagnosis not present

## 2022-08-31 DIAGNOSIS — H43811 Vitreous degeneration, right eye: Secondary | ICD-10-CM | POA: Diagnosis not present

## 2022-08-31 DIAGNOSIS — H25813 Combined forms of age-related cataract, bilateral: Secondary | ICD-10-CM | POA: Diagnosis not present

## 2022-08-31 DIAGNOSIS — H17822 Peripheral opacity of cornea, left eye: Secondary | ICD-10-CM | POA: Diagnosis not present

## 2022-08-31 DIAGNOSIS — H04123 Dry eye syndrome of bilateral lacrimal glands: Secondary | ICD-10-CM | POA: Diagnosis not present

## 2022-10-13 DIAGNOSIS — Z0000 Encounter for general adult medical examination without abnormal findings: Secondary | ICD-10-CM | POA: Diagnosis not present

## 2022-10-13 DIAGNOSIS — Z23 Encounter for immunization: Secondary | ICD-10-CM | POA: Diagnosis not present

## 2022-10-13 DIAGNOSIS — Z79899 Other long term (current) drug therapy: Secondary | ICD-10-CM | POA: Diagnosis not present

## 2022-10-13 DIAGNOSIS — E559 Vitamin D deficiency, unspecified: Secondary | ICD-10-CM | POA: Diagnosis not present

## 2022-10-13 DIAGNOSIS — I700 Atherosclerosis of aorta: Secondary | ICD-10-CM | POA: Diagnosis not present

## 2022-10-13 DIAGNOSIS — Z1211 Encounter for screening for malignant neoplasm of colon: Secondary | ICD-10-CM | POA: Diagnosis not present

## 2022-10-13 DIAGNOSIS — Z13220 Encounter for screening for lipoid disorders: Secondary | ICD-10-CM | POA: Diagnosis not present

## 2022-10-13 DIAGNOSIS — R7303 Prediabetes: Secondary | ICD-10-CM | POA: Diagnosis not present

## 2022-10-13 DIAGNOSIS — M810 Age-related osteoporosis without current pathological fracture: Secondary | ICD-10-CM | POA: Diagnosis not present

## 2022-11-19 ENCOUNTER — Other Ambulatory Visit: Payer: Self-pay | Admitting: Family Medicine

## 2022-11-19 DIAGNOSIS — Z0000 Encounter for general adult medical examination without abnormal findings: Secondary | ICD-10-CM

## 2022-12-17 ENCOUNTER — Ambulatory Visit: Payer: Medicare Other

## 2022-12-24 ENCOUNTER — Ambulatory Visit
Admission: RE | Admit: 2022-12-24 | Discharge: 2022-12-24 | Disposition: A | Discharge status: Home / Self Care | Payer: Medicare Other | Source: Ambulatory Visit | Attending: Family Medicine | Admitting: Family Medicine

## 2022-12-24 DIAGNOSIS — Z0000 Encounter for general adult medical examination without abnormal findings: Secondary | ICD-10-CM

## 2022-12-24 DIAGNOSIS — Z1231 Encounter for screening mammogram for malignant neoplasm of breast: Secondary | ICD-10-CM | POA: Diagnosis not present

## 2023-02-02 IMAGING — MG MM DIGITAL SCREENING BILAT W/ TOMO AND CAD
8 series · 9 of 24 positions shown · non-contrast
Comparison: Previous exam(s).

CLINICAL DATA: Screening.

EXAM:
DIGITAL SCREENING BILATERAL MAMMOGRAM WITH TOMOSYNTHESIS AND CAD
TECHNIQUE: Bilateral screening digital craniocaudal and mediolateral oblique
mammograms were obtained. Bilateral screening digital breast
tomosynthesis was performed. The images were evaluated with
computer-aided detection.

[L CC synth-2D]
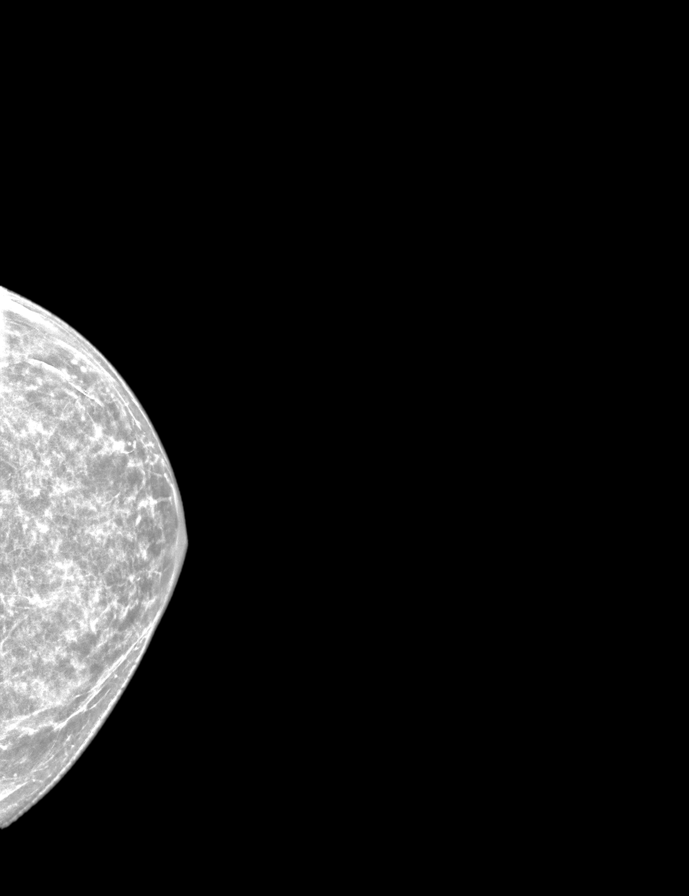

[R MLO synth-2D]
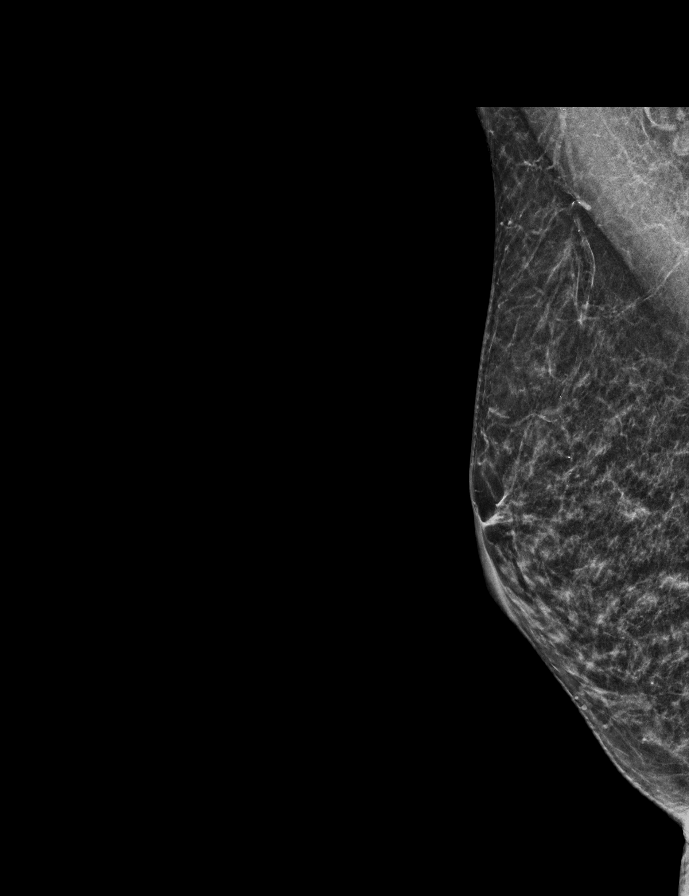

[L MLO synth-2D]
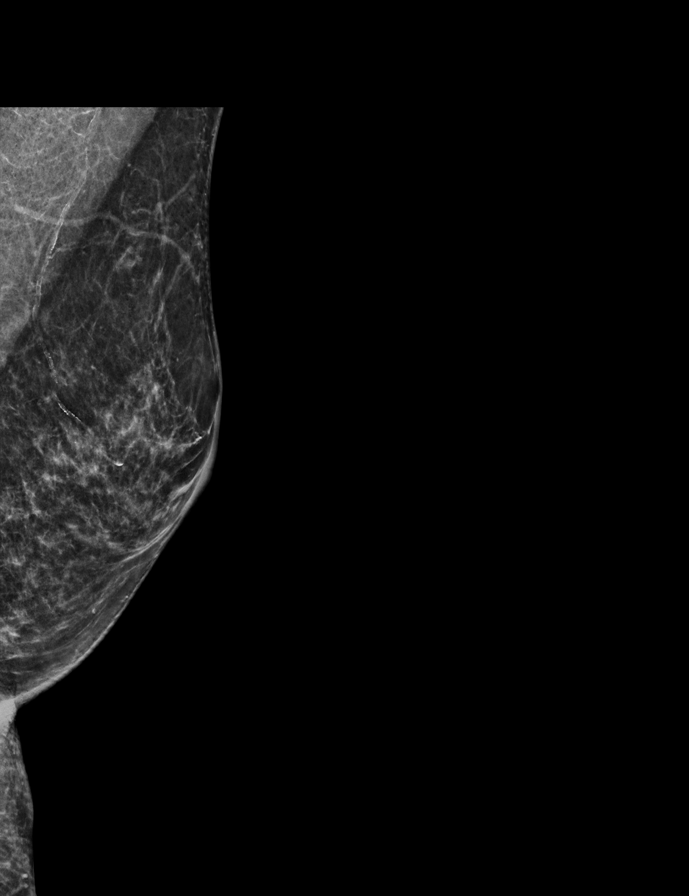

[R CC synth-2D]
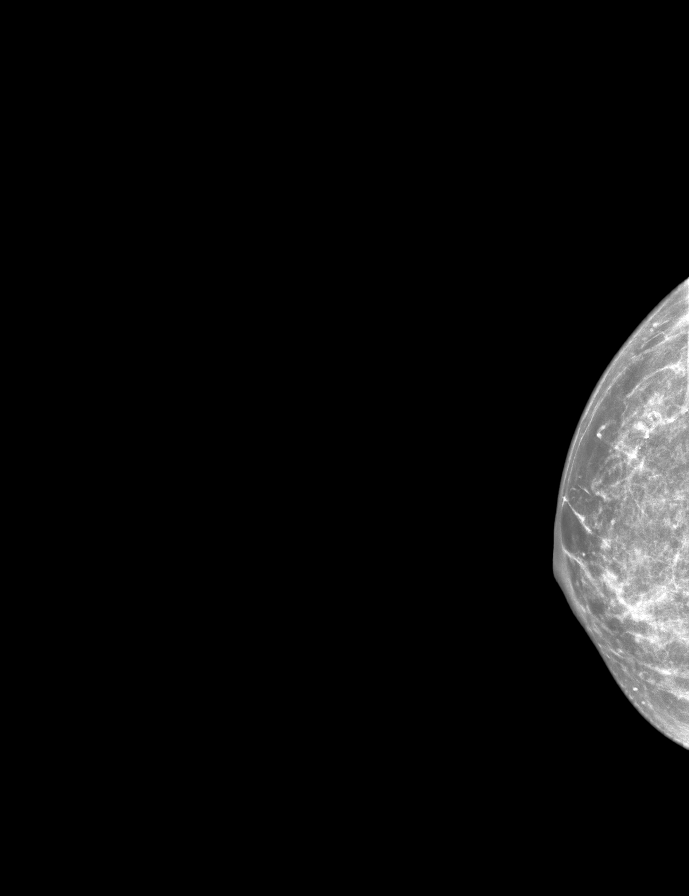

[L MLO tomo · 2 of 52 frames shown]
[frame 17/52]
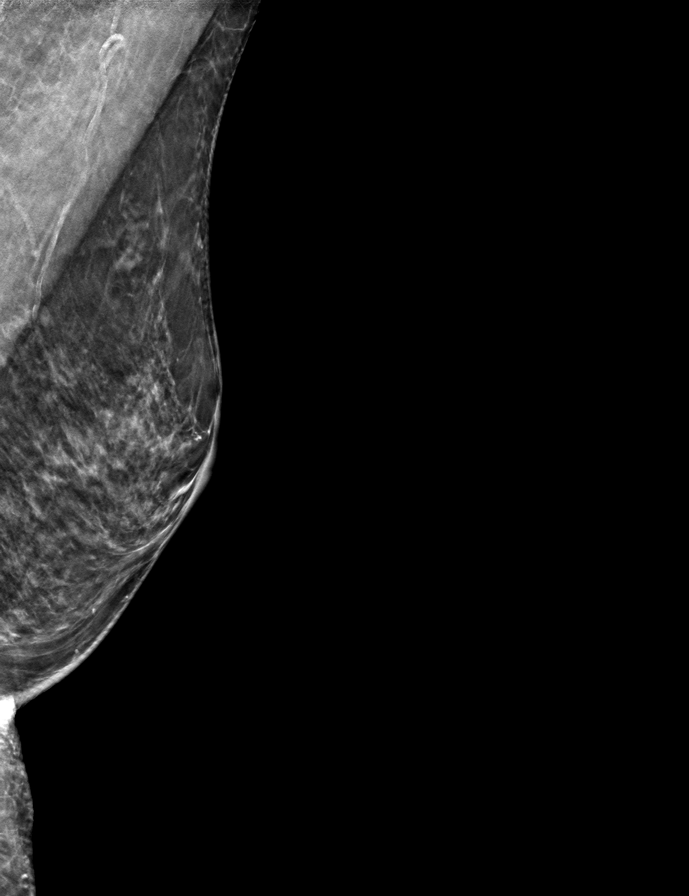
[frame 27/52]
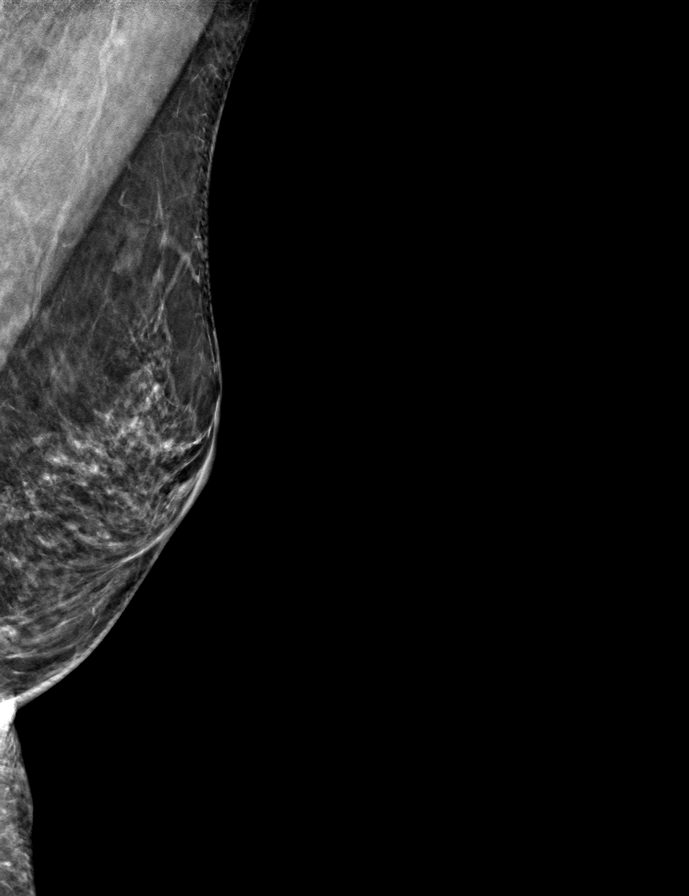

[R MLO tomo · tomo slice 25/48.0]
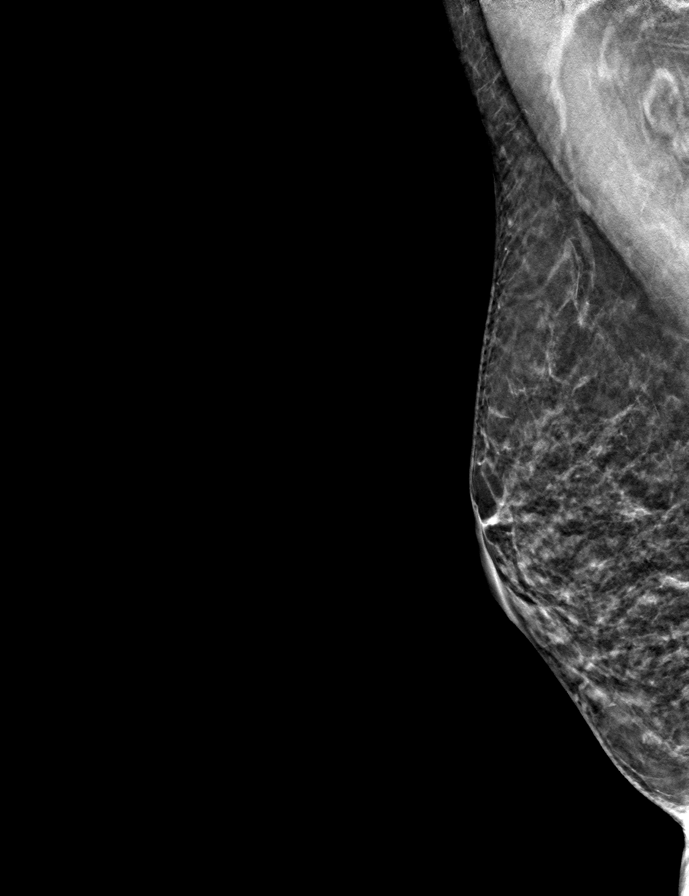

[L CC tomo · tomo slice 29/56.0]
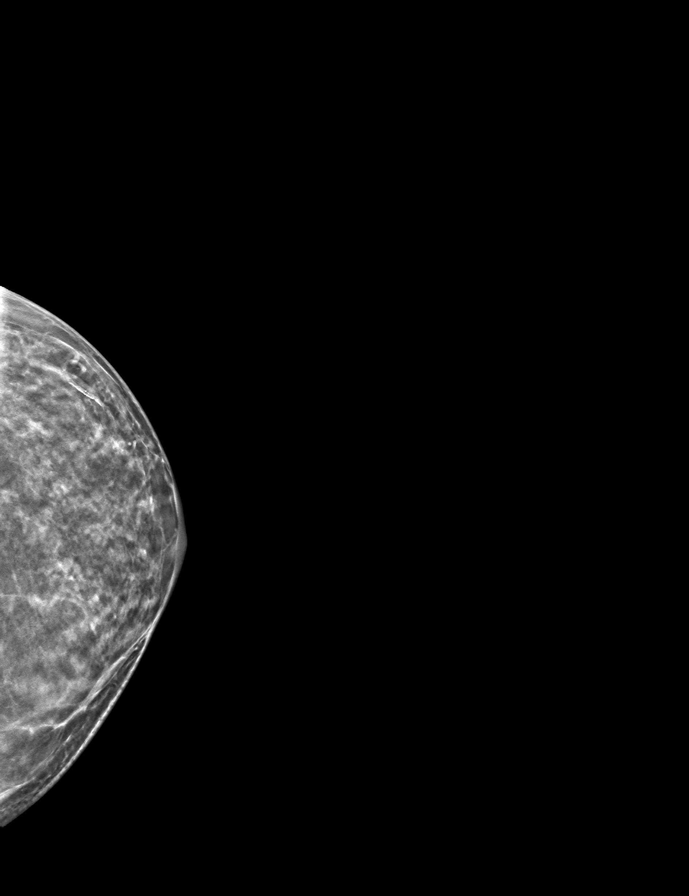

[R CC tomo · tomo slice 29/56.0]
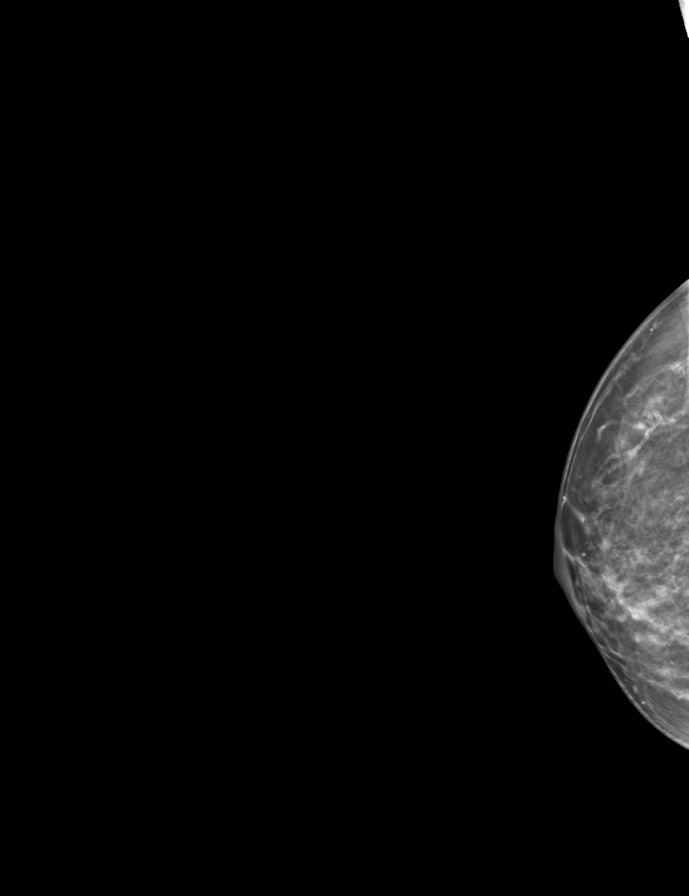

[9 of 24 positions shown; findings below may reference images not displayed]

ACR Breast Density Category c: The breast tissue is heterogeneously
dense, which may obscure small masses.
FINDINGS: There are no findings suspicious for malignancy.
IMPRESSION: No mammographic evidence of malignancy. A result letter of this
screening mammogram will be mailed directly to the patient.

RECOMMENDATION:
Screening mammogram in one year. (Code:[id number]-W-BC3)

BI-RADS CATEGORY  1: Negative.

## 2023-03-22 DIAGNOSIS — K080 Exfoliation of teeth due to systemic causes: Secondary | ICD-10-CM | POA: Diagnosis not present

## 2023-05-09 DIAGNOSIS — H43811 Vitreous degeneration, right eye: Secondary | ICD-10-CM | POA: Diagnosis not present

## 2023-05-09 DIAGNOSIS — H18423 Band keratopathy, bilateral: Secondary | ICD-10-CM | POA: Diagnosis not present

## 2023-05-09 DIAGNOSIS — H25811 Combined forms of age-related cataract, right eye: Secondary | ICD-10-CM | POA: Diagnosis not present

## 2023-05-09 DIAGNOSIS — H04123 Dry eye syndrome of bilateral lacrimal glands: Secondary | ICD-10-CM | POA: Diagnosis not present

## 2023-05-09 DIAGNOSIS — H17822 Peripheral opacity of cornea, left eye: Secondary | ICD-10-CM | POA: Diagnosis not present

## 2023-06-21 DIAGNOSIS — H2511 Age-related nuclear cataract, right eye: Secondary | ICD-10-CM | POA: Diagnosis not present

## 2023-07-05 DIAGNOSIS — H2512 Age-related nuclear cataract, left eye: Secondary | ICD-10-CM | POA: Diagnosis not present

## 2023-09-19 DIAGNOSIS — K080 Exfoliation of teeth due to systemic causes: Secondary | ICD-10-CM | POA: Diagnosis not present

## 2023-11-04 DIAGNOSIS — Z7982 Long term (current) use of aspirin: Secondary | ICD-10-CM | POA: Diagnosis not present

## 2023-11-11 DIAGNOSIS — M810 Age-related osteoporosis without current pathological fracture: Secondary | ICD-10-CM | POA: Diagnosis not present

## 2023-11-11 DIAGNOSIS — I700 Atherosclerosis of aorta: Secondary | ICD-10-CM | POA: Diagnosis not present

## 2023-11-11 DIAGNOSIS — Z1331 Encounter for screening for depression: Secondary | ICD-10-CM | POA: Diagnosis not present

## 2023-11-11 DIAGNOSIS — Z0000 Encounter for general adult medical examination without abnormal findings: Secondary | ICD-10-CM | POA: Diagnosis not present

## 2023-11-11 DIAGNOSIS — E559 Vitamin D deficiency, unspecified: Secondary | ICD-10-CM | POA: Diagnosis not present

## 2023-11-11 DIAGNOSIS — R7303 Prediabetes: Secondary | ICD-10-CM | POA: Diagnosis not present

## 2023-11-15 ENCOUNTER — Other Ambulatory Visit: Payer: Self-pay | Admitting: Family Medicine

## 2023-11-15 DIAGNOSIS — Z1231 Encounter for screening mammogram for malignant neoplasm of breast: Secondary | ICD-10-CM

## 2023-12-26 ENCOUNTER — Ambulatory Visit
Admission: RE | Admit: 2023-12-26 | Discharge: 2023-12-26 | Disposition: A | Discharge status: Home / Self Care | Source: Ambulatory Visit | Attending: Family Medicine | Admitting: Family Medicine

## 2023-12-26 DIAGNOSIS — Z1231 Encounter for screening mammogram for malignant neoplasm of breast: Secondary | ICD-10-CM

## 2024-01-30 DIAGNOSIS — H17822 Peripheral opacity of cornea, left eye: Secondary | ICD-10-CM | POA: Diagnosis not present

## 2024-01-30 DIAGNOSIS — H04123 Dry eye syndrome of bilateral lacrimal glands: Secondary | ICD-10-CM | POA: Diagnosis not present

## 2024-01-30 DIAGNOSIS — H43811 Vitreous degeneration, right eye: Secondary | ICD-10-CM | POA: Diagnosis not present

## 2024-01-30 DIAGNOSIS — H18423 Band keratopathy, bilateral: Secondary | ICD-10-CM | POA: Diagnosis not present
